# Patient Record
Sex: Female | Born: 1990 | Race: Black or African American | Hispanic: No | Marital: Single | Smoking: Never smoker
Health system: Southern US, Community
[De-identification: ages and names within clinical notes are randomized; demographics above are authoritative.]

## PROBLEM LIST (undated history)

## (undated) DIAGNOSIS — G40909 Epilepsy, unspecified, not intractable, without status epilepticus: Secondary | ICD-10-CM

---

## 2016-06-17 ENCOUNTER — Emergency Department (HOSPITAL_COMMUNITY): Payer: 59

## 2016-06-17 ENCOUNTER — Emergency Department (HOSPITAL_COMMUNITY)
Admission: EM | Admit: 2016-06-17 | Discharge: 2016-06-17 | Disposition: A | Discharge status: Home / Self Care | Payer: 59 | Attending: Emergency Medicine | Admitting: Emergency Medicine

## 2016-06-17 ENCOUNTER — Encounter (HOSPITAL_COMMUNITY): Payer: Self-pay | Admitting: Emergency Medicine

## 2016-06-17 DIAGNOSIS — S82832A Other fracture of upper and lower end of left fibula, initial encounter for closed fracture: Secondary | ICD-10-CM | POA: Diagnosis not present

## 2016-06-17 DIAGNOSIS — Y9321 Activity, ice skating: Secondary | ICD-10-CM | POA: Insufficient documentation

## 2016-06-17 DIAGNOSIS — Y929 Unspecified place or not applicable: Secondary | ICD-10-CM | POA: Insufficient documentation

## 2016-06-17 DIAGNOSIS — S99912A Unspecified injury of left ankle, initial encounter: Secondary | ICD-10-CM | POA: Diagnosis present

## 2016-06-17 DIAGNOSIS — S82839A Other fracture of upper and lower end of unspecified fibula, initial encounter for closed fracture: Secondary | ICD-10-CM

## 2016-06-17 DIAGNOSIS — Y999 Unspecified external cause status: Secondary | ICD-10-CM | POA: Insufficient documentation

## 2016-06-17 HISTORY — DX: Epilepsy, unspecified, not intractable, without status epilepticus: G40.909

## 2016-06-17 MED ORDER — HYDROCODONE-ACETAMINOPHEN 5-325 MG PO TABS: ORAL_TABLET | ORAL | 0 refills | Status: AC

## 2016-06-17 MED ORDER — IBUPROFEN 600 MG PO TABS: 600.0000 mg | ORAL_TABLET | Freq: Four times a day (QID) | ORAL | 0 refills | Status: AC

## 2016-06-17 NOTE — ED Provider Notes (Signed)
[facility]-EMERGENCY DEPT Provider Note   CSN: [id number] Arrival date & time: 06/17/16  1935  By signing my name below, I, Marnette BurgessRyan Andrew Long, attest that this documentation has been prepared under the direction and in the presence of Ivery QualeHobson Rhena Glace, PA-C. Electronically Signed: Marnette Burgessyan Andrew Long, Scribe. 06/17/2016. 8:14 PM.  History   Chief Complaint Chief Complaint  Patient presents with  . Ankle Injury   The history is provided by the patient. No language interpreter was used.  Ankle Injury  The current episode started yesterday. The problem occurs constantly. The symptoms are aggravated by walking. Nothing relieves the symptoms. Karen Kirby has tried a cold compress for the symptoms. The treatment provided mild relief.    HPI Comments:  Karen Kirby is an obese 26 y.o. female with a PMHx of Epilepsy, who presents to the Emergency Department complaining of constant, 5/10 left, lateral ankle pain s/p a fall yesterday. Karen Kirby reports roller skating yesterday when Karen Kirby fell at ground level, twisting, falling,and striking her left ankle on the skate and ground. Karen Kirby is seen in the ED today with moderate pain and associated ankle swelling seeking assessment for a possible fracture. Karen Kirby tried ice and application of Biofreeze to the ankle at home with minimal relief of her pain. Direct palpation and ambulation exacerbate her pain. Pt denies any other injuries at this time.   Past Medical History:  Diagnosis Date  . Epilepsy (HCC)     There are no active problems to display for this patient.   History reviewed. No pertinent surgical history.  OB History    No data available     Home Medications    Prior to Admission medications   Not on File    Family History No family history on file.  Social History Social History  Substance Use Topics  . Smoking status: Never Smoker  . Smokeless tobacco: Not on file  . Alcohol use No     Allergies   Depakote [divalproex sodium]   Review of  Systems Review of Systems 10 systems reviewed and all are negative for acute change except as noted in the HPI.   Physical Exam Updated Vital Signs BP 109/80 (BP Location: Right Arm)   Pulse 103   Temp 97.9 F (36.6 C) (Oral)   Resp 16   Ht 5\' 5"  (1.651 m)   Wt 250 lb (113.4 kg)   SpO2 100%   BMI 41.60 kg/m   Physical Exam  Constitutional: Karen Kirby is oriented to person, place, and time. Karen Kirby appears well-developed and well-nourished.  HENT:  Head: Normocephalic.  Eyes: Conjunctivae are normal.  Cardiovascular: Normal rate.   Pulmonary/Chest: Effort normal.  Abdominal: Karen Kirby exhibits no distension.  Musculoskeletal: Normal range of motion.  No temperature changes of the left lower extremity. There is a deformity of the anterior tibial area. Capillary refill <2 seconds. No deformity of the toes. No lesions between the toes. Dorsalis pedis pulse 2+. Posterior tibial pulse 2+. Pain and moderate swelling of the lateral malleolus. Achilles tendon is intact. No evidence of dislocation.   Neurological: Karen Kirby is alert and oriented to person, place, and time.  No neuro deficits noted.  Skin: Skin is warm and dry.  Psychiatric: Karen Kirby has a normal mood and affect.  Nursing note and vitals reviewed.    ED Treatments / Results  DIAGNOSTIC STUDIES:  Oxygen Saturation is 100% on RA, normal by my interpretation.    COORDINATION OF CARE:  8:13 PM Discussed treatment plan with pt at bedside including  XR of left ankle with pain medication and pt agreed to plan.  Labs (all labs ordered are listed, but only abnormal results are displayed) Labs Reviewed - No data to display  EKG  EKG Interpretation None       Radiology No results found.  Procedures Procedures (including critical care time)  Medications Ordered in ED Medications - No data to display   Initial Impression / Assessment and Plan / ED Course  I have reviewed the triage vital signs and the nursing notes.  Pertinent labs &  imaging results that were available during my care of the patient were reviewed by me and considered in my medical decision making (see chart for details).     **I have reviewed nursing notes, vital signs, and all appropriate lab and imaging results for this patient.*  Final Clinical Impressions(s) / ED Diagnoses   MDM- Pt injured left ankle yesterday and continues to have pain and swelling to the lateral malleolus. Will obtain an xray of the ankle to evaluate for fracture.  XR of the left ankle shows a spiral fracture of the distal fibula. The pt will be placed in a CAM Walker and referred to orthopedics. Karen Kirby will be given medication for pain. Ice and elevation strongly suggested.  Final diagnoses:  Closed fracture of distal end of fibula, unspecified fracture morphology, initial encounter    New Prescriptions Discharge Medication List as of 06/17/2016 10:05 PM    START taking these medications   Details  HYDROcodone-acetaminophen (NORCO/VICODIN) 5-325 MG tablet 1 or 2 tabs PO q6 hours prn pain, Print    ibuprofen (ADVIL,MOTRIN) 600 MG tablet Take 1 tablet (600 mg total) by mouth 4 (four) times daily., Starting Sun 06/17/2016, Print       **I personally performed the services described in this documentation, which was scribed in my presence. The recorded information has been reviewed and is accurate.Ivery Quale, PA-C 06/18/16 0107    Laurence Spates, MD 06/19/16 [phone]

## 2016-06-17 NOTE — ED Triage Notes (Signed)
Pt was skating yesterday, fell and hurt her left ankle. Pt states she iced it and wants to make sure nothing is broken.

## 2016-06-17 NOTE — Discharge Instructions (Signed)
You have a fracture of the bone of the lateral portion of your ankle caught the fibula. Please keep your left leg elevated above your heart when you're lying down, and above your waist when you're sitting. Please apply ice. Use 600 mg of ibuprofen with breakfast, lunch, dinner, and at bedtime. Use Norco for more severe pain. Norco may cause drowsiness, please do not drive, operate machinery, or participate in activities requiring concentration when taking this medication. Please see Dr. Lajoyce Cornersuda or a member of his team for orthopedic management of this fracture as soon as possible.

## 2016-06-17 NOTE — ED Notes (Signed)
ED Provider at bedside. 

## 2016-06-17 NOTE — ED Notes (Signed)
Patient was alert, oriented and stable upon discharge. RN went over AVS and patient had no further questions.  Patient was instructed not to drive or operate heavy machinery on narcotic pain medication.   

## 2016-06-25 ENCOUNTER — Encounter (INDEPENDENT_AMBULATORY_CARE_PROVIDER_SITE_OTHER): Payer: Self-pay | Admitting: Orthopaedic Surgery

## 2016-06-25 ENCOUNTER — Ambulatory Visit (INDEPENDENT_AMBULATORY_CARE_PROVIDER_SITE_OTHER): Payer: PRIVATE HEALTH INSURANCE | Admitting: Orthopaedic Surgery

## 2016-06-25 DIAGNOSIS — S8262XA Displaced fracture of lateral malleolus of left fibula, initial encounter for closed fracture: Secondary | ICD-10-CM | POA: Insufficient documentation

## 2016-06-25 DIAGNOSIS — S8265XA Nondisplaced fracture of lateral malleolus of left fibula, initial encounter for closed fracture: Secondary | ICD-10-CM | POA: Diagnosis not present

## 2016-06-25 NOTE — Progress Notes (Signed)
   Office Visit Note   Patient: Karen Kirby           Date of Birth: 06-22-1990           MRN: [id number] Visit Date: 06/25/2016              Requested by: No referring provider defined for this encounter. PCP: No PCP Per Patient   Assessment & Plan: Visit Diagnoses: No diagnosis found.  Plan: Fracture should be amenable to nonoperative treatment. I would like her to be nonweightbearing with a knee scooter for the next 3 weeks. Follow-up at that time with repeat 3 view x-rays of the left ankle. If  Follow-Up Instructions: Return in about 3 weeks (around 07/16/2016).   Orders:  No orders of the defined types were placed in this encounter.  No orders of the defined types were placed in this encounter.     Procedures: No procedures performed   Clinical Data: No additional findings.   Subjective: Chief Complaint  Patient presents with  . Left Ankle - Fracture, Pain    Patient is a 26 year old female who injured her left ankle while ice skating last week. X-ray showed a nondisplaced spiral is still fibula fracture. Her pain is moderate worse with swelling and activity. She has been weightbearing with a cam walker without significant pain. She's been taking ibuprofen. Pain does not radiate.    Review of Systems  Constitutional: Negative.   HENT: Negative.   Eyes: Negative.   Respiratory: Negative.   Cardiovascular: Negative.   Endocrine: Negative.   Musculoskeletal: Negative.   Neurological: Negative.   Hematological: Negative.   Psychiatric/Behavioral: Negative.   All other systems reviewed and are negative.    Objective: Vital Signs: There were no vitals taken for this visit.  Physical Exam  Constitutional: She is oriented to person, place, and time. She appears well-developed and well-nourished.  HENT:  Head: Normocephalic and atraumatic.  Eyes: EOM are normal.  Neck: Neck supple.  Pulmonary/Chest: Effort normal.  Abdominal: Soft.  Neurological: She  is alert and oriented to person, place, and time.  Skin: Skin is warm. Capillary refill takes less than 2 seconds.  Psychiatric: She has a normal mood and affect. Her behavior is normal. Judgment and thought content normal.  Nursing note and vitals reviewed.   Ortho Exam Exam of the left ankle shows mild swelling and bruising. She is neurovascularly intact. Specialty Comments:  No specialty comments available.  Imaging: No results found.   PMFS History: There are no active problems to display for this patient.  Past Medical History:  Diagnosis Date  . Epilepsy (HCC)     No family history on file.  No past surgical history on file. Social History   Occupational History  . Not on file.   Social History Main Topics  . Smoking status: Never Smoker  . Smokeless tobacco: Never Used  . Alcohol use No  . Drug use: Unknown  . Sexual activity: Not on file

## 2016-06-26 ENCOUNTER — Telehealth (INDEPENDENT_AMBULATORY_CARE_PROVIDER_SITE_OTHER): Payer: Self-pay | Admitting: Orthopaedic Surgery

## 2016-06-26 NOTE — Telephone Encounter (Signed)
Called Patient to see what was going on and she states she has bruising. Per Dr Roda Shuttersxu the bruising is normal.

## 2016-06-26 NOTE — Telephone Encounter (Signed)
Patient called stating that she has some questions for Dr. Roda ShuttersXU.  CB#[phone].  Thank you.

## 2016-07-16 ENCOUNTER — Ambulatory Visit (INDEPENDENT_AMBULATORY_CARE_PROVIDER_SITE_OTHER): Payer: Self-pay | Admitting: Orthopaedic Surgery

## 2016-07-16 ENCOUNTER — Ambulatory Visit (INDEPENDENT_AMBULATORY_CARE_PROVIDER_SITE_OTHER): Payer: PRIVATE HEALTH INSURANCE

## 2016-07-16 ENCOUNTER — Encounter (INDEPENDENT_AMBULATORY_CARE_PROVIDER_SITE_OTHER): Payer: Self-pay | Admitting: Orthopaedic Surgery

## 2016-07-16 DIAGNOSIS — S8265XD Nondisplaced fracture of lateral malleolus of left fibula, subsequent encounter for closed fracture with routine healing: Secondary | ICD-10-CM

## 2016-07-16 NOTE — Progress Notes (Signed)
Patient is 4 weeks status post nondisplaced fibular fracture. Her pain is well-controlled. She is using a scooter. She has minimal swelling. Exam is benign. X-rays show stable alignment with bridging callus formation. At this point begin weight-bear as tolerated in a cam walker with physical therapy. Follow-up in 6 weeks with repeat 3 view x-rays of the left ankle. Questions encouraged and answered. 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

## 2016-08-27 ENCOUNTER — Ambulatory Visit (INDEPENDENT_AMBULATORY_CARE_PROVIDER_SITE_OTHER): Payer: PRIVATE HEALTH INSURANCE | Admitting: Orthopaedic Surgery

## 2016-08-28 ENCOUNTER — Ambulatory Visit (INDEPENDENT_AMBULATORY_CARE_PROVIDER_SITE_OTHER): Payer: Self-pay | Admitting: Orthopaedic Surgery

## 2016-08-28 ENCOUNTER — Encounter (INDEPENDENT_AMBULATORY_CARE_PROVIDER_SITE_OTHER): Payer: Self-pay

## 2016-08-28 ENCOUNTER — Encounter (INDEPENDENT_AMBULATORY_CARE_PROVIDER_SITE_OTHER): Payer: Self-pay | Admitting: Orthopaedic Surgery

## 2016-08-28 ENCOUNTER — Ambulatory Visit (INDEPENDENT_AMBULATORY_CARE_PROVIDER_SITE_OTHER): Payer: PRIVATE HEALTH INSURANCE

## 2016-08-28 DIAGNOSIS — S8265XD Nondisplaced fracture of lateral malleolus of left fibula, subsequent encounter for closed fracture with routine healing: Secondary | ICD-10-CM | POA: Diagnosis not present

## 2016-08-28 NOTE — Progress Notes (Signed)
Patient is 6 weeks status post nondisplaced fibula fracture. Well. She doing physical therapy. She has mild swelling of her ankle. She denies any pain. X-ray show stable alignment with bridging callus formation. At this point she can start to wean cam boot with physical therapy. Continue with progressive strengthening and mobilization. Follow-up in 6 weeks with repeat 3 view x-rays of the left ankle. Anticipate discharging her at that time.

## 2016-10-09 ENCOUNTER — Ambulatory Visit (INDEPENDENT_AMBULATORY_CARE_PROVIDER_SITE_OTHER): Payer: Self-pay | Admitting: Orthopaedic Surgery

## 2016-10-09 ENCOUNTER — Ambulatory Visit (INDEPENDENT_AMBULATORY_CARE_PROVIDER_SITE_OTHER): Payer: PRIVATE HEALTH INSURANCE

## 2016-10-09 ENCOUNTER — Encounter (INDEPENDENT_AMBULATORY_CARE_PROVIDER_SITE_OTHER): Payer: Self-pay | Admitting: Orthopaedic Surgery

## 2016-10-09 DIAGNOSIS — S8265XD Nondisplaced fracture of lateral malleolus of left fibula, subsequent encounter for closed fracture with routine healing: Secondary | ICD-10-CM | POA: Diagnosis not present

## 2016-10-09 NOTE — Progress Notes (Signed)
Patient is 3 months status post left distal fibula fracture. She is doing well. She denies any complaints. She has no swelling on exam. Benign ankle. X-ray show healed distal fibula fracture. At this point she is doing well and has reached a half. Follow-up with me as needed.

## 2016-11-01 ENCOUNTER — Telehealth (INDEPENDENT_AMBULATORY_CARE_PROVIDER_SITE_OTHER): Payer: Self-pay | Admitting: Orthopaedic Surgery

## 2016-11-01 NOTE — Telephone Encounter (Signed)
Plan of Care faxed 10/31/16 °

## 2018-03-09 IMAGING — CR DG ANKLE COMPLETE 3+V*L*
3 series · 3 of 3 positions shown · non-contrast
Comparison: None.

CLINICAL DATA: Left ankle injury while ice skating

EXAM:
LEFT ANKLE COMPLETE - 3+ VIEW

[x ankle ap left]
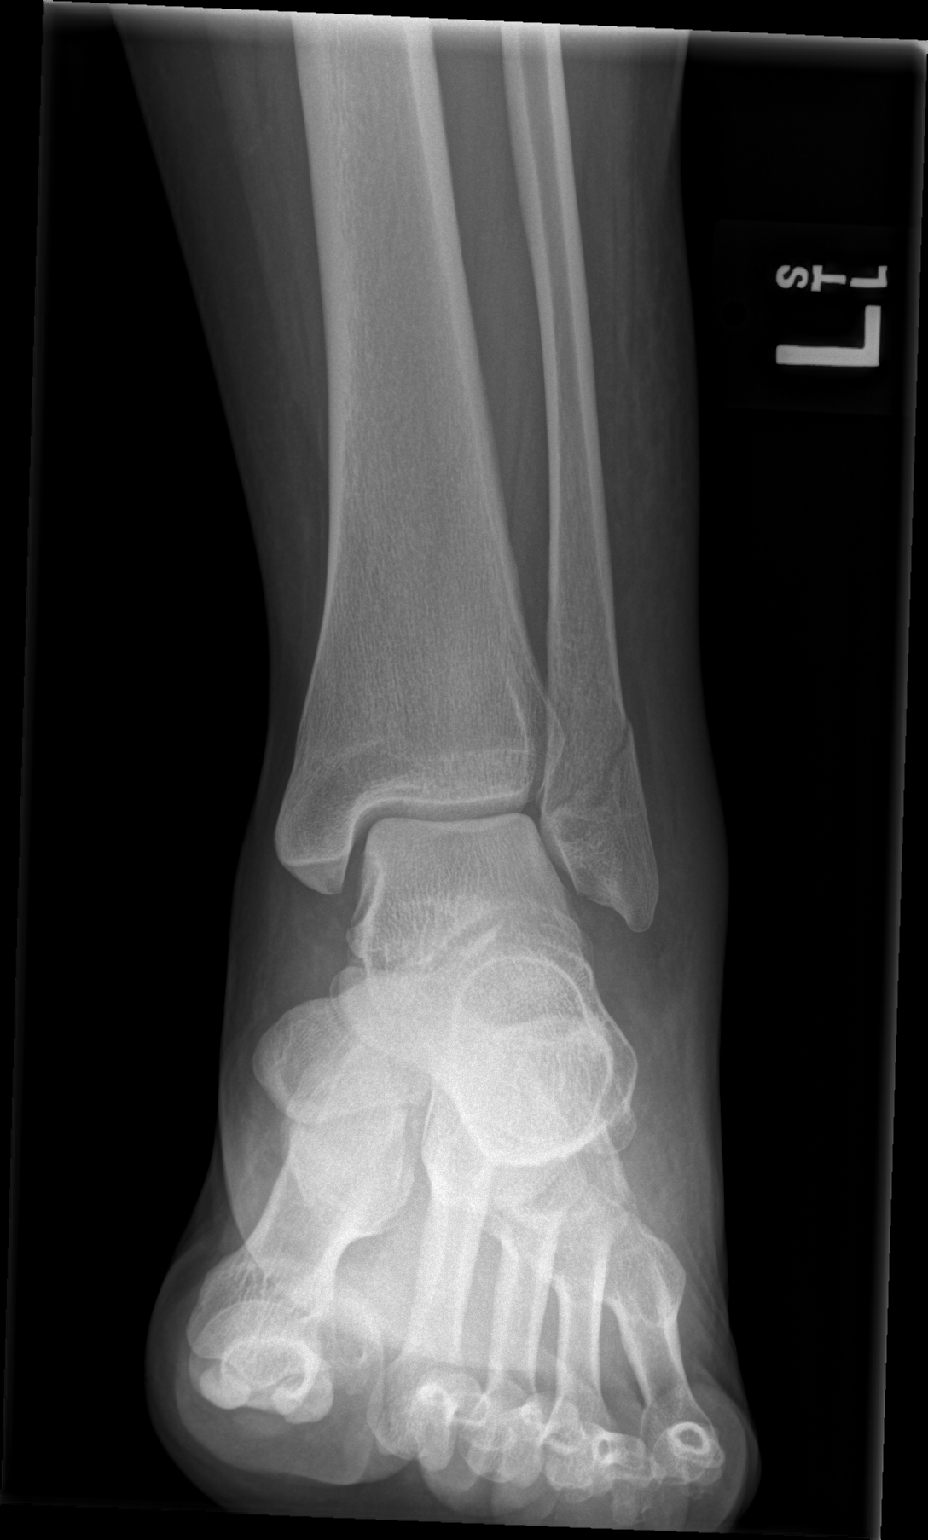

[x ankle obl left]
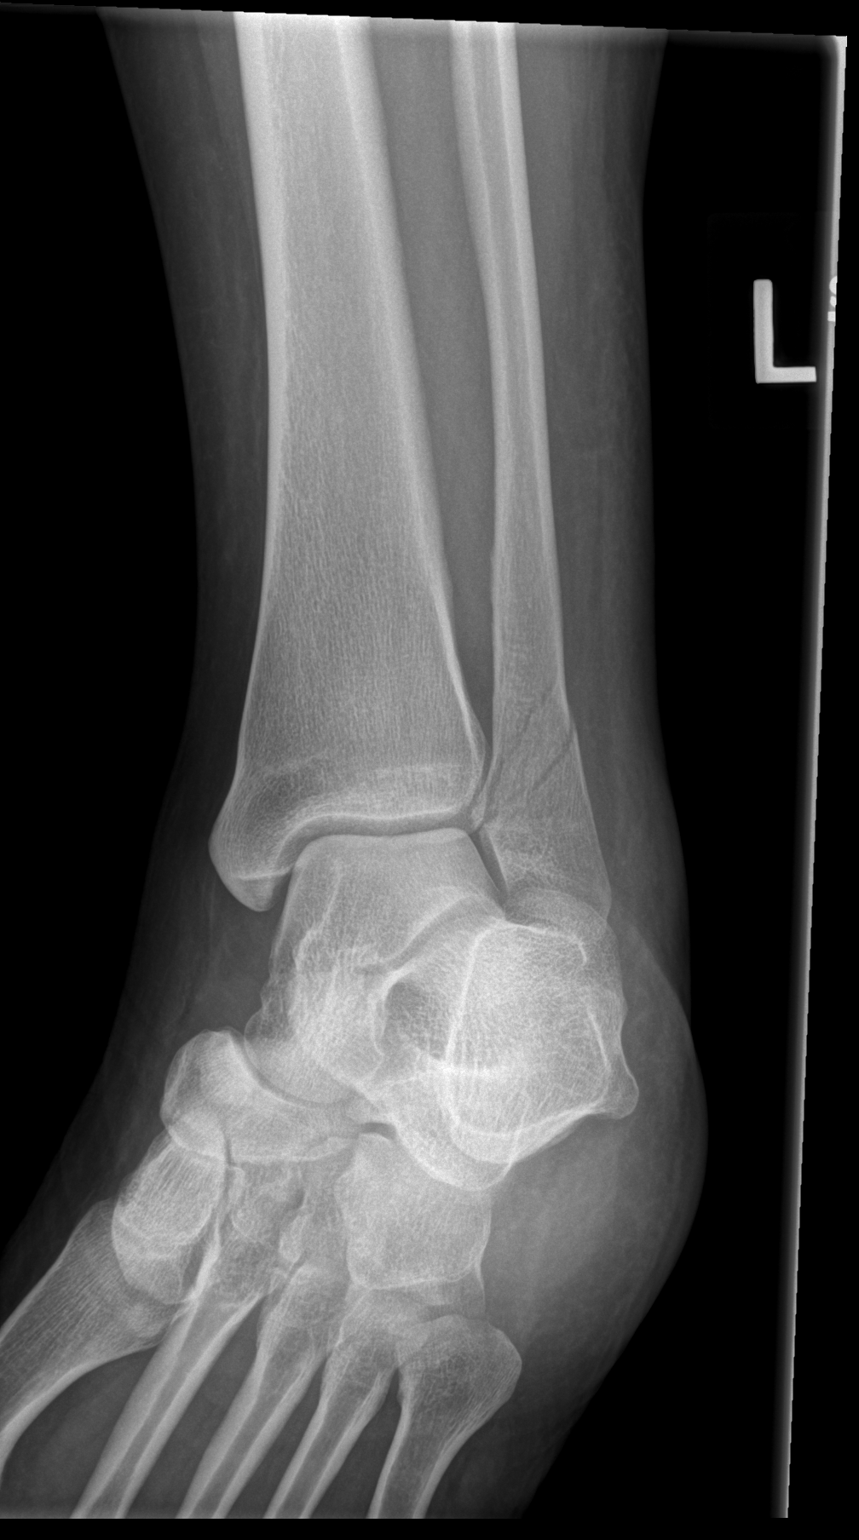

[x ankle lat left]
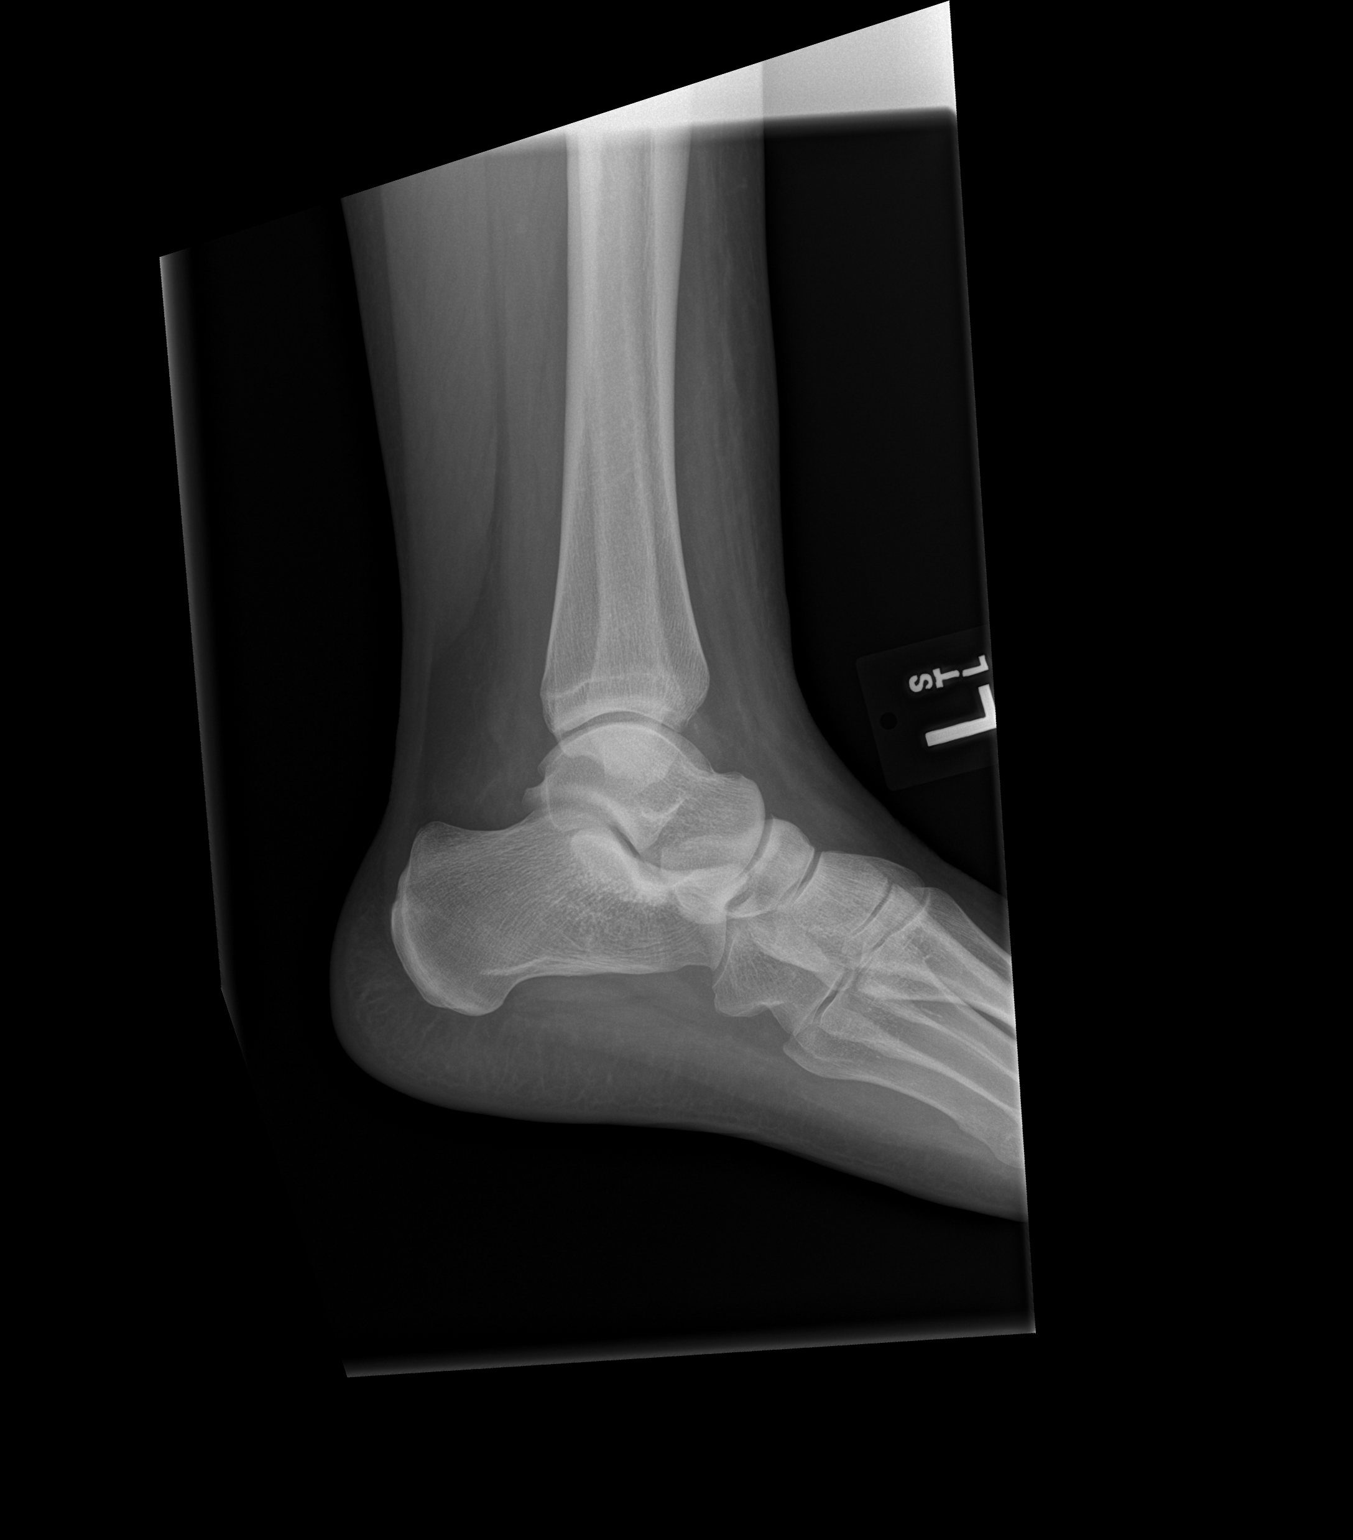

[3 of 3 positions shown; findings below may reference images not displayed]

FINDINGS: Oblique fracture of the distal fibula, nondisplaced.

Ankle mortise is preserved.

The base of the fifth metatarsal is unremarkable.

Moderate lateral soft tissue swelling.
IMPRESSION: Nondisplaced oblique fracture of the distal fibula.
